# Patient Record
Sex: Male | Born: 1940 | Race: White | Hispanic: No | Smoking: Former smoker
Health system: Southern US, Community
[De-identification: ages and names within clinical notes are randomized; demographics above are authoritative.]

## PROBLEM LIST (undated history)

## (undated) DIAGNOSIS — C61 Malignant neoplasm of prostate: Secondary | ICD-10-CM

## (undated) DIAGNOSIS — K219 Gastro-esophageal reflux disease without esophagitis: Secondary | ICD-10-CM

## (undated) DIAGNOSIS — I10 Essential (primary) hypertension: Secondary | ICD-10-CM

## (undated) DIAGNOSIS — E7800 Pure hypercholesterolemia, unspecified: Secondary | ICD-10-CM

## (undated) HISTORY — DX: Malignant neoplasm of prostate: C61

## (undated) HISTORY — DX: Gastro-esophageal reflux disease without esophagitis: K21.9

## (undated) HISTORY — DX: Pure hypercholesterolemia, unspecified: E78.00

## (undated) HISTORY — PX: APPENDECTOMY: SHX54

## (undated) HISTORY — DX: Essential (primary) hypertension: I10

---

## 2011-02-25 ENCOUNTER — Other Ambulatory Visit: Payer: Self-pay | Admitting: Urology

## 2011-02-25 ENCOUNTER — Encounter (HOSPITAL_COMMUNITY): Payer: Medicare PPO

## 2011-02-25 DIAGNOSIS — Z9089 Acquired absence of other organs: Secondary | ICD-10-CM | POA: Insufficient documentation

## 2011-02-25 DIAGNOSIS — I10 Essential (primary) hypertension: Secondary | ICD-10-CM | POA: Insufficient documentation

## 2011-02-25 DIAGNOSIS — K219 Gastro-esophageal reflux disease without esophagitis: Secondary | ICD-10-CM | POA: Insufficient documentation

## 2011-02-25 DIAGNOSIS — Z79899 Other long term (current) drug therapy: Secondary | ICD-10-CM | POA: Insufficient documentation

## 2011-02-25 DIAGNOSIS — E7800 Pure hypercholesterolemia, unspecified: Secondary | ICD-10-CM | POA: Insufficient documentation

## 2011-02-25 DIAGNOSIS — C61 Malignant neoplasm of prostate: Secondary | ICD-10-CM | POA: Insufficient documentation

## 2011-02-25 DIAGNOSIS — Z01812 Encounter for preprocedural laboratory examination: Secondary | ICD-10-CM | POA: Insufficient documentation

## 2011-02-25 LAB — BASIC METABOLIC PANEL
BUN: 19 mg/dL (ref 6–23)
Chloride: 102 mEq/L (ref 96–112)
Creatinine, Ser: 1.04 mg/dL (ref 0.4–1.5)
GFR calc non Af Amer: >60 mL/min (ref >60)
Glucose, Bld: 101 mg/dL — ABNORMAL HIGH (ref 70–99)
Potassium: 4.2 mEq/L (ref 3.5–5.1)

## 2011-02-25 LAB — CBC
HCT: 39.4 % (ref 39.0–52.0)
MCHC: 34.3 g/dL (ref 30.0–36.0)
MCV: 85.5 fL (ref 78.0–100.0)
Platelets: 200 10*3/uL (ref 150–400)
RDW: 12.7 % (ref 11.5–15.5)

## 2011-03-04 ENCOUNTER — Inpatient Hospital Stay (HOSPITAL_COMMUNITY)
Admission: RE | Admit: 2011-03-04 | Discharge: 2011-03-05 | DRG: 708 | Disposition: A | Discharge status: Home / Self Care | Payer: Medicare PPO | Source: Ambulatory Visit | Attending: Urology | Admitting: Urology

## 2011-03-04 ENCOUNTER — Other Ambulatory Visit: Payer: Self-pay | Admitting: Urology

## 2011-03-04 DIAGNOSIS — I10 Essential (primary) hypertension: Secondary | ICD-10-CM | POA: Diagnosis present

## 2011-03-04 DIAGNOSIS — K219 Gastro-esophageal reflux disease without esophagitis: Secondary | ICD-10-CM | POA: Diagnosis present

## 2011-03-04 DIAGNOSIS — E7800 Pure hypercholesterolemia, unspecified: Secondary | ICD-10-CM | POA: Diagnosis present

## 2011-03-04 DIAGNOSIS — Z79899 Other long term (current) drug therapy: Secondary | ICD-10-CM

## 2011-03-04 DIAGNOSIS — C61 Malignant neoplasm of prostate: Principal | ICD-10-CM | POA: Diagnosis present

## 2011-03-04 DIAGNOSIS — Z01812 Encounter for preprocedural laboratory examination: Secondary | ICD-10-CM

## 2011-03-04 HISTORY — PX: OTHER SURGICAL HISTORY: SHX169

## 2011-03-04 LAB — ABO/RH: ABO/RH(D): O POS

## 2011-03-04 LAB — HEMOGLOBIN AND HEMATOCRIT, BLOOD: Hemoglobin: 13 g/dL (ref 13.0–17.0)

## 2011-03-10 NOTE — Op Note (Addendum)
Carl Moran, Carl Moran              ACCOUNT NO.:  [id number]  MEDICAL RECORD NO.:  [id number]  LOCATION:  PADM                         FACILITY:  [facility]  PHYSICIAN:  Heloise Purpura, MD      DATE OF BIRTH:  02-11-1941  DATE OF PROCEDURE:  03/04/2011 DATE OF DISCHARGE:  02/25/2011                              OPERATIVE REPORT   PREOPERATIVE DIAGNOSIS:  Clinically localized adenocarcinoma of the prostate (clinical stage T1C, N0, M0).  POSTOPERATIVE DIAGNOSIS:  Clinically localized adenocarcinoma of the prostate (clinical stage T1C, N0, M0).  PROCEDURE: 1. Robotic assisted laparoscopic radical prostatectomy (bilateral     nerve sparing). 2. Bilateral robotic assisted laparoscopic pelvic lymphadenectomy.  SURGEON:  Heloise Purpura, M.D.  ASSISTANT:  Delia Chimes, N.P.C.  ANESTHESIA:  General.  COMPLICATIONS:  None.  ESTIMATED BLOOD LOSS:  100 mL.  INTRAVENOUS FLUIDS:  1500 mL of lactated Ringer's.  SPECIMENS: 1. Prostate and seminal vesicles. 2. Bladder neck margin. 3. Right pelvic lymph nodes. 4. Left pelvic lymph nodes.  DISPOSITION:  Specimens to pathology.  DRAINS: 1. 20-French Quinton catheter. 2. #19 Blake pelvic drain.  INDICATION:  Mr. Zebrowski is a 70 year old gentleman, who was found to have an elevated PSA and underwent a prostate biopsy, which demonstrated a small volume Gleason 8 adenocarcinoma.  He underwent a metastatic evaluation, which was negative.  He initially had decided to proceed with a combination of androgen deprivation and external beam radiation therapy; however, he elected to reconsider his options especially considering his significantly enlarged prostate, which had measured 180 mL by ultrasound.  After further discussion, he elected to proceed with surgical treatment of his prostate cancer and a robotic prostatectomy. The potential risks, complications and alternative treatment options were discussed in detail and informed consent  obtained.  DESCRIPTION OF PROCEDURE:  The patient was taken to the operating room and a general anesthetic was administered.  He was given preoperative antibiotics, placed in the dorsal lithotomy position and prepped and draped in the usual sterile fashion.  Next, the preoperative time-out was performed.  A Foley catheter was then inserted into the bladder and the site was selected just superior to the umbilicus for placement of the camera port.  This was placed using a standard open Hassan technique which allowed entry into the peritoneal cavity under direct vision without difficulty.  A 12 mm port was then placed and a pneumoperitoneum established.  With the 0 degrees lens, the abdomen was inspected and there was no evidence of any intra-abdominal injuries or other abnormalities.  The remaining ports were then placed.  The 8 mm robotic ports were placed in the right lower quadrant, left lower quadrant and far left lower quadrant.  All ports were placed under direct vision without difficulty.  A 5 mm port was placed in the right upper quadrant and 12-mm port was placed in the far right lateral abdominal wall for laparoscopic assistance.  All ports were placed under direct vision and without difficulty.  The surgical cart was then docked.  There were noted to be some adhesions likely related to his prior appendectomy. These adhesions between the small intestine and the abdominal wall were carefully taken down with sharp scissor dissection.  The bladder was then reflected posteriorly allowing entry into the space of Retzius and identification of the endopelvic fascia and prostate.  The prostate was noted to be significantly enlarged, although there did appear to be some room on either side of the prostate, which would allow for removal robotically.  The periprosthetic fat was removed and the endopelvic fascia was then incised from the apex back to the base of the prostate bilaterally and  the underlying levator muscle fibers were swept laterally off the prostate.  The dorsal vein was then stapled and divided with a 45 mm flex echelon stapler.  The bladder neck was identified with the help of Foley catheter manipulation and was divided anteriorly.  There was noted to be a small median lobe, but mostly lateral lobe hypertrophy was identified.  Indigo carmine had been administered and the ureteral orifices were identified.  Dissection then proceeded to the posterior bladder neck and between the bladder and prostate until the vasa deferentia and seminal vesicles were identified. During this dissection, it was noted that within the bladder there was multiple small stone fragments, which were removed.  This was likely result of his BPH.  In addition, there was an area of tissue toward the right side of the bladder neck that did look somewhat suspicious for prostate tissue.  This was therefore excised and sent as a bladder neck margin.  The vasa deferentia were then identified, isolated and lifted anteriorly.  The seminal vesicles were dissected down to their tips with care to control the seminal vesicle arterial blood supply.  These structures were then also lifted anteriorly and the space between the Denonvillier's fascia and the anterior rectum was bluntly developed. This helped to isolate the vascular pedicles of prostate.  The lateral prostatic fascia was then sharply incised bilaterally allowing neurovascular bundle preservation.  The vascular pedicles of the prostate were then ligated with Hem-o-lok clips above the level of the neurovascular bundles and divided with sharp cold scissor dissection. The urethra was then sharply transected allowing the specimen to be disarticulated.  The pelvis was copiously irrigated and hemostasis was ensured.  There was noted be some venous bleeding along the right neurovascular bundle, which was controlled with a figure-of-eight  Vicryl suture.  Attention then turned to the right pelvic sidewall.  The fibrofatty tissue between the external iliac vein, confluence of the iliac vessels, hypogastric artery and Cooper's ligament was dissected free from the pelvic sidewall with care to preserve the obturator nerve. Hem-o-lok clips were used for lymphostasis and hemostasis.  An identical procedure was performed on the contralateral side and both lymphatic packets were subsequently removed for permanent pathologic analysis. Attention then turned to the urethral anastomosis.  A 2-0 Vicryl slip- knot was placed between Denonvillier's fascia, the posterior bladder neck and the posterior urethra to reapproximate these structures.  A double-armed 3-0 Monocryl suture was then used to perform a 360 degrees running tension-free anastomosis between the bladder neck and urethra. A new 20-French Quinton catheter was inserted into the bladder and irrigated.  There were no blood clots within the bladder and the anastomosis appeared to be watertight.  A #19 Blake drain was then brought through the left robotic port and positioned appropriately within the pelvis.  It was secured to skin with a nylon suture.  The surgical cart was then undocked.  The right lateral 12-mm port site was closed with the 0 Vicryl suture and placed laparoscopically.  All remaining ports were removed under direct vision and the  prostate and lymphatic packets were removed intact within separate Endopouch retrieval bag via the periumbilical port site.  This fascial opening was then closed with two running 0 Vicryl sutures.  0.25% percent Marcaine was then injected into all port sites, which were reapproximated at the skin level with staples.  Sterile dressings were applied.  The patient appeared to tolerate the procedure well and without complications.  He was able to be extubated and transferred to the recovery unit in satisfactory condition.     Heloise Purpura, MD     LB/MEDQ  D:  03/04/2011  T:  03/04/2011  Job:  [id number]  Electronically Signed by Heloise Purpura MD on 03/24/2011 10:46:40 PM

## 2011-03-11 NOTE — Discharge Summary (Signed)
Carl Moran, Carl Moran              ACCOUNT NO.:  [id number]  MEDICAL RECORD NO.:  [id number]           PATIENT TYPE:  I  LOCATION:  1441                         FACILITY:  [facility]  PHYSICIAN:  Heloise Purpura, MD      DATE OF BIRTH:  [birth year]  DATE OF ADMISSION:  03/04/2011 DATE OF DISCHARGE:  03/05/2011                              DISCHARGE SUMMARY   ADMISSION DIAGNOSIS:  Clinically localized adenocarcinoma of the prostate (clinical stage T1c, N0, M0).  DISCHARGE DIAGNOSIS:  Clinically localized adenocarcinoma of the prostate (clinical stage T1c, N0, M0).  PROCEDURES: 1. Robotic-assisted laparoscopic radical prostatectomy (bilateral     nerve-sparing). 2. Bilateral robotic-assisted laparoscopic pelvic lymphadenectomy.  HISTORY AND PHYSICAL:  For full details, please see admission history and physical.  Briefly, Carl Moran is a 70 year old gentleman who was found to have clinically localized adenocarcinoma of the prostate. After careful consideration regarding management options for treatment, he elected to proceed with surgical therapy and a robotic-assisted laparoscopic radical prostatectomy.  HOSPITAL COURSE:  On Mar 04, 2011, he was taken to the operating room where he underwent the above-named procedures, which he tolerated well and without complications.  Postoperatively he was able to be transferred to a regular room following recovery from anesthesia.  He did have one episodes of emesis last p.m., but was found to have no further complaints by postoperative day #1.  Therefore, he was able to tolerate a clear liquid diet.  He was able to begin ambulation postoperatively.  Postoperatively he was found to have a low urine output.  Therefore, his Foley catheter was hand-irrigated.  It was irrigated well and had no clots.  Therefore, a fluid bolus was given. His urine output increased dramatically.  He was found to have excellent urine output overnight on postoperative day  #1 with minimal output from his pelvic drain.  Therefore, his pelvic drain was removed.  He was found to be hemodynamically stable postoperatively as noted by a hemoglobin of 13.0.  He was also found to be hemodynamically stable on postoperative day #1 as noted by a hemoglobin of 11.7.  On the afternoon of postoperative day #1, he was tolerating a clear liquid diet without further complaints of nausea and vomiting.  He was able to ambulate without difficulty and his pain was well-managed, although he did complain of chronic low back pain, which was similar to his baseline. He was felt, therefore, stable for discharge home as he had met all discharge criteria.  DISPOSITION:  Home.  DISCHARGE INSTRUCTIONS:  He was instructed to be ambulatory, but specifically told to refrain from any heavy lifting, strenuous activity or driving.  He was instructed on routine Foley catheter care and told to gradually advance his diet over the course of the next few days.  DISCHARGE MEDICATIONS:  He was instructed to resume all home medications consisting of vitamin D2, losartan, terazosin, carvedilol, simvastatin and omeprazole.  In addition, he was provided a prescription for Vicodin to take as needed for pain and told to use Colace as a stool softener. He was also given a prescription for Cipro to begin one day prior to  return visit for removal of Foley catheter.  FOLLOWUP:  He will follow up in 7 days for further postoperative evaluation consisting of skin staple removal and removal of Foley catheter.     Delia Chimes, NP   ______________________________ Heloise Purpura, MD    MA/MEDQ  D:  03/05/2011  T:  03/05/2011  Job:  [id number]  Electronically Signed by Delia Chimes NP on 03/11/2011 03:09:46 PM Electronically Signed by Heloise Purpura MD on 03/11/2011 11:32:26 PM

## 2014-02-01 ENCOUNTER — Encounter: Payer: Self-pay | Admitting: Radiation Oncology

## 2014-02-01 NOTE — Progress Notes (Signed)
GU Location of Tumor / Histology: prostatic adenocarcinoma  If Prostate Cancer, Gleason Score is (4 + 3) and PSA is (10.27)  July 2012 PSA 0.18 September 2011 PSA 0.21 March 2012 PSA 0.19 December 2012 PSA 0.02 April 2013 PSA 0.06 July 2013 PSA 0.05 January 2014 PSA 0.37  Biopsies of prostate (if applicable) revealed:     Past/Anticipated interventions by urology, if any: prostatectomy  Past/Anticipated interventions by medical oncology, if any: NONE  Weight changes, if any: None noted  Bowel/Bladder complaints, if any: reports excellent urinary control and improved ED   Nausea/Vomiting, if any: None noted  Pain issues, if any:  None noted  SAFETY ISSUES:  Prior radiation? NO  Pacemaker/ICD? NO  Possible current pregnancy? N/A  Is the patient on methotrexate? N/A  Current Complaints / other details:  73 year old male. With biochemical recurrence of prostate cancer. Here to discuss salvage radiation. Divorced.

## 2014-02-04 ENCOUNTER — Ambulatory Visit: Payer: Medicare PPO

## 2014-02-04 ENCOUNTER — Ambulatory Visit: Payer: Medicare PPO | Admitting: Radiation Oncology

## 2014-02-24 ENCOUNTER — Encounter: Payer: Self-pay | Admitting: Radiation Oncology

## 2014-02-24 DIAGNOSIS — C61 Malignant neoplasm of prostate: Secondary | ICD-10-CM | POA: Insufficient documentation

## 2014-02-24 NOTE — Progress Notes (Signed)
Radiation Oncology         [phone]) [phone] ________________________________  Initial outpatient Consultation  Name: Carl Moran MRN: [id number]  Date: 02/25/2014  DOB: 06-17-1941  KD:TOIZTI,WPY, MD  Dutch Gray, MD   REFERRING PHYSICIAN: Dutch Gray, MD  DIAGNOSIS: 73 y.o. gentleman with stage T2c adenocarcinoma of the prostate with a Gleason's score of 4+3 and a post prostatectomy rising PSA of 0.37  HISTORY OF PRESENT ILLNESS::Carl Moran is a 72 y.o. gentleman found to have an elevated PSA of 10.27 prompting the diagnosis of high-grade localized prostate cancer on 02/22/2014 from transrectal ultrasound with 12 biopsies. These showed adenocarcinoma with Gleason score of 4+4 as displayed below:  He elected to undergo robotic-assisted laparoscopic radical prostatectomy with Dr. Alinda Money on 03/04/2011. Pathology from that procedure shown below demonstrated Gleason's 4+3 disease involving the bilateral gland including the apices. The surgical margins were negative, no extracapsular extension was noted, and no seminal vesicle involvement was noted. However, there is a comment that tumor approached to within 1 mm of one margin.   Postoperatively, the patient recovered his urinary continence in erectile function very well. His PSA became detectable and has steadily risen in followup to a most recent value of 0.37 on 01/08/2014      The patient reviewed the PSA results with his urologist and he has kindly been referred today for discussion of potential radiation treatment options.  PREVIOUS RADIATION THERAPY: No  PAST MEDICAL HISTORY:  has a past medical history of Prostate cancer; GERD (gastroesophageal reflux disease); Hypercholesterolemia; and Hypertension.    PAST SURGICAL HISTORY: Past Surgical History  Procedure Laterality Date  . Appendectomy    . History of prostatect retropubic radical w/nerve sparing laparoscopic  03/04/2011    FAMILY HISTORY: family history includes  Cancer in his brother, sister, and sister.  SOCIAL HISTORY:  reports that he quit smoking about 46 years ago. His smoking use included Cigarettes. He has a 4 pack-year smoking history. He has never used smokeless tobacco. He reports that he does not drink alcohol or use illicit drugs.  ALLERGIES: Review of patient's allergies indicates no known allergies.  MEDICATIONS:  Current Outpatient Prescriptions  Medication Sig Dispense Refill  . esomeprazole (NEXIUM) 40 MG capsule Take 40 mg by mouth daily at 12 noon.       No current facility-administered medications for this encounter.    REVIEW OF SYSTEMS:  A 15 point review of systems is documented in the electronic medical record. This was obtained by the nursing staff. However, I reviewed this with the patient to discuss relevant findings and make appropriate changes.  A comprehensive review of systems was negative..  The patient completed an IPSS and IIEF questionnaire.  His IPSS score was 6 indicating mild urinary outflow obstructive symptoms. He denies incontinence. He indicated that his erectile function is able to complete sexual activity on most attempts, especially with Viagra.   PHYSICAL EXAM: This patient is in no acute distress.  He is alert and oriented.   height is 6' (1.829 m) and weight is 207 lb 9.6 oz (94.167 kg). His oral temperature is 98 F (36.7 C). His blood pressure is 181/86 and his pulse is 57. His respiration is 16 and oxygen saturation is 100%.  He exhibits no respiratory distress or labored breathing.  He appears neurologically intact.  His mood is pleasant.  His affect is appropriate.  Please note the digital rectal exam findings described above.  KPS = 100  100 - Normal; no complaints; no  evidence of disease. 90   - Able to carry on normal activity; minor signs or symptoms of disease. 80   - Normal activity with effort; some signs or symptoms of disease. 61   - Cares for self; unable to carry on normal activity or to  do active work. 60   - Requires occasional assistance, but is able to care for most of his personal needs. 50   - Requires considerable assistance and frequent medical care. 48   - Disabled; requires special care and assistance. 79   - Severely disabled; hospital admission is indicated although death not imminent. 36   - Very sick; hospital admission necessary; active supportive treatment necessary. 10   - Moribund; fatal processes progressing rapidly. 0     - Dead  Karnofsky DA, Abelmann Cross, Craver LS and Burchenal [facility] [phone]) The use of the nitrogen mustards in the palliative treatment of carcinoma: with particular reference to bronchogenic carcinoma Cancer 1 634-56   LABORATORY DATA:  Lab Results  Component Value Date   WBC 7.1 02/25/2011   HGB 11.7* 03/05/2011   HCT 33.7* 03/05/2011   MCV 85.5 02/25/2011   PLT 200 02/25/2011   Lab Results  Component Value Date   NA 136 02/25/2011   K 4.2 02/25/2011   CL 102 02/25/2011   CO2 25 02/25/2011   No results found for this basename: ALT,  AST,  GGT,  ALKPHOS,  BILITOT     RADIOGRAPHY: No results found.    IMPRESSION: This gentleman is a 73 y.o. gentleman with stage T2c adenocarcinoma of the prostate with a Gleason's score of 4+3 and a post prostatectomy rising PSA of 0.37.  He has experienced biochemical recurrence. He did not have any typical adverse pathologic features to predict for local recurrence, other than one mention of a very close surgical margin. At this point, the patient may potentially benefit from radiotherapy to the prostatic fossa for salvage.  PLAN:  Today, I talked to the patient and family about the findings and work-up thus far.  We discussed the natural history of biochemically recurrent prostate cancer and general treatment, highlighting the role or radiotherapy in the management.  We discussed the available radiation techniques, and focused on the details of logistics and delivery.  We reviewed the anticipated acute and  late sequelae associated with radiation in this setting.  The patient was encouraged to ask questions that I answered to the best of my ability.  I filled out a patient counseling form during our discussion including treatment diagrams.  We retained a copy for our records.  The patient would like to consider proceeding with radiation and will be scheduled for CT simulation, but, has a trip to [facility] and will call when he gets back to set up simulation.  I spent 60 minutes minutes face to face with the patient and more than 50% of that time was spent in counseling and/or coordination of care.   ------------------------------------------------  Sheral Apley. Tammi Klippel, M.D.

## 2014-02-25 ENCOUNTER — Ambulatory Visit
Admission: RE | Admit: 2014-02-25 | Discharge: 2014-02-25 | Disposition: A | Discharge status: Home / Self Care | Payer: Medicare Other | Source: Ambulatory Visit | Attending: Radiation Oncology | Admitting: Radiation Oncology

## 2014-02-25 ENCOUNTER — Encounter: Payer: Self-pay | Admitting: Radiation Oncology

## 2014-02-25 VITALS — BP 181/86 | HR 57 | Temp 98.0°F | Resp 16 | Ht 72.0 in | Wt 207.6 lb

## 2014-02-25 DIAGNOSIS — C61 Malignant neoplasm of prostate: Secondary | ICD-10-CM | POA: Diagnosis present

## 2014-02-25 NOTE — Progress Notes (Signed)
See progress note under physician encounter. 

## 2014-02-25 NOTE — Progress Notes (Signed)
Reports occasional nocturia x 1 dependent on how much fluid he drinks before bed. Denies frequency. Denies hematuria or dysuria. Denies blood in stool, pain associated without bowel movements or diarrhea. Reports that he remains active playing music and exercising. Reports about a week ago he experience right hip pain while riding home from a show. Denies incontinence. Reports his urine stream is strong and steady. Reports improved erectile function. Most concerned about how radiation will affect his sex life. Denies hot flashes. Denies weight loss.

## 2014-02-27 NOTE — Addendum Note (Signed)
Encounter addended by: Cote Mayabb Mintz Teran Daughenbaugh, RN on: 02/27/2014  6:58 PM<BR>     Documentation filed: Charges VN

## 2014-03-01 ENCOUNTER — Encounter: Payer: Self-pay | Admitting: *Deleted

## 2014-03-01 NOTE — Progress Notes (Signed)
[facility] Psychosocial Distress Screening Clinical Social Work  Clinical Social Work was referred by distress screening protocol.  The patient scored a 5 on the Psychosocial Distress Thermometer which indicates moderate distress. Clinical Social Worker phoned pt to assess for distress and other psychosocial needs. CSW left message for pt to follow up with CSW as needed.  ONCBCN DISTRESS SCREENING 02/25/2014  Screening Type Initial Screening  Elta [place] the number that describes how much distress you have been experiencing in the past week 5  Family Problem type Partner  Emotional problem type Nervousness/Anxiety;Adjusting to illness  Information Concerns Type Lack of info about diagnosis;Lack of info about maintaining fitness  Physician notified of physical symptoms Yes  Referral to clinical psychology No  Referral to clinical social work No  Referral to dietition No  Referral to financial advocate No  Referral to support programs No  Referral to palliative care No    Clinical Social Worker follow up needed: no  If yes, follow up plan:   Loren Racer, [place]. [place] for [place] Wednesday, Thursday and Friday Phone: [phone] Fax: [phone]

## 2016-06-08 ENCOUNTER — Emergency Department (HOSPITAL_COMMUNITY): Payer: Medicare HMO

## 2016-06-08 ENCOUNTER — Emergency Department (HOSPITAL_COMMUNITY)
Admission: EM | Admit: 2016-06-08 | Discharge: 2016-06-08 | Disposition: A | Discharge status: Home / Self Care | Payer: Medicare HMO | Attending: Emergency Medicine | Admitting: Emergency Medicine

## 2016-06-08 ENCOUNTER — Encounter (HOSPITAL_COMMUNITY): Payer: Self-pay | Admitting: Emergency Medicine

## 2016-06-08 DIAGNOSIS — C419 Malignant neoplasm of bone and articular cartilage, unspecified: Secondary | ICD-10-CM | POA: Diagnosis not present

## 2016-06-08 DIAGNOSIS — R0789 Other chest pain: Secondary | ICD-10-CM | POA: Diagnosis not present

## 2016-06-08 DIAGNOSIS — I10 Essential (primary) hypertension: Secondary | ICD-10-CM | POA: Insufficient documentation

## 2016-06-08 DIAGNOSIS — C7951 Secondary malignant neoplasm of bone: Secondary | ICD-10-CM

## 2016-06-08 DIAGNOSIS — Z87891 Personal history of nicotine dependence: Secondary | ICD-10-CM | POA: Insufficient documentation

## 2016-06-08 DIAGNOSIS — Z8546 Personal history of malignant neoplasm of prostate: Secondary | ICD-10-CM | POA: Insufficient documentation

## 2016-06-08 DIAGNOSIS — M546 Pain in thoracic spine: Secondary | ICD-10-CM

## 2016-06-08 DIAGNOSIS — R079 Chest pain, unspecified: Secondary | ICD-10-CM | POA: Diagnosis present

## 2016-06-08 LAB — BASIC METABOLIC PANEL
ANION GAP: 5 (ref 5–15)
BUN: 12 mg/dL (ref 6–20)
CALCIUM: 9.2 mg/dL (ref 8.9–10.3)
CO2: 25 mmol/L (ref 22–32)
Chloride: 106 mmol/L (ref 101–111)
Creatinine, Ser: 1.23 mg/dL (ref 0.61–1.24)
GFR calc Af Amer: >60 mL/min (ref >60)
GFR, EST NON AFRICAN AMERICAN: 56 mL/min — AB (ref >60)
Glucose, Bld: 107 mg/dL — ABNORMAL HIGH (ref 65–99)
POTASSIUM: 4.6 mmol/L (ref 3.5–5.1)
SODIUM: 136 mmol/L (ref 135–145)

## 2016-06-08 LAB — CBC
HEMATOCRIT: 43 % (ref 39.0–52.0)
HEMOGLOBIN: 14 g/dL (ref 13.0–17.0)
MCH: 29.2 pg (ref 26.0–34.0)
MCHC: 32.6 g/dL (ref 30.0–36.0)
MCV: 89.8 fL (ref 78.0–100.0)
Platelets: 213 10*3/uL (ref 150–400)
RBC: 4.79 MIL/uL (ref 4.22–5.81)
RDW: 13.5 % (ref 11.5–15.5)
WBC: 10 10*3/uL (ref 4.0–10.5)

## 2016-06-08 LAB — I-STAT TROPONIN, ED: TROPONIN I, POC: 0 ng/mL (ref 0.00–0.08)

## 2016-06-08 MED ORDER — OXYCODONE-ACETAMINOPHEN 5-325 MG PO TABS: 1.0000 | ORAL_TABLET | ORAL | 0 refills | Status: DC | PRN

## 2016-06-08 MED ORDER — OXYCODONE-ACETAMINOPHEN 5-325 MG PO TABS
2.0000 | ORAL_TABLET | Freq: Once | ORAL | Status: AC
  Administered 2016-06-08: 2 via ORAL
  Filled 2016-06-08: qty 2

## 2016-06-08 NOTE — ED Notes (Signed)
MD at bedside. 

## 2016-06-08 NOTE — ED Triage Notes (Signed)
Pt sts CP x 6 months and sts back pain; pt sts hx of cancer on rib

## 2016-06-08 NOTE — ED Provider Notes (Signed)
[facility] DEPT Provider Note   CSN: [id number] Arrival date & time: 06/08/16  1002     History   Chief Complaint Chief Complaint  Patient presents with  . Chest Pain  . Back Pain    HPI Carl Moran is a 75 y.o. male.  75 year old male with past mental history including prostate cancer, GERD, hypertension, hyperlipidemia who presents with chest pain and back pain. The patient has a 6 month history of left lateral chest pain and upper back pain between his shoulder blades. The pain is sharp, intermittent, worse with certain movements, and worse with laying on these areas. Recently he was noted to have a lesion on his third left rib and had a biopsy and bone scan but has not received results from a biopsy yet. He did some lifting over the weekend which exacerbated the pain and he presented to an outside hospital ER 1 day ago. He was discharged home with pain medications which he has been taking but he reports persistent, severe pain. Last dose of Percocet was 1 tablet early this morning. He denies any associated shortness of breath or fevers. He does endorse decreased appetite. He is waiting to hear back from his PCP regarding the cancer workup.   The history is provided by the patient.  Chest Pain   Associated symptoms include back pain.  Back Pain   Associated symptoms include chest pain.    Past Medical History:  Diagnosis Date  . GERD (gastroesophageal reflux disease)   . Hypercholesterolemia   . Hypertension   . Prostate cancer [facility])     Patient Active Problem List   Diagnosis Date Noted  . Malignant neoplasm of prostate ([place]) 02/24/2014    Past Surgical History:  Procedure Laterality Date  . APPENDECTOMY    . history of prostatect retropubic radical w/nerve sparing laparoscopic  03/04/2011       Home Medications    Prior to Admission medications   Medication Sig Start Date End Date Taking? Authorizing Provider  esomeprazole (NEXIUM) 40 MG capsule Take  40 mg by mouth daily at 12 noon.    Historical Provider, MD    Family History Family History  Problem Relation Age of Onset  . Cancer Sister     kidney  . Cancer Brother     kidney  . Cancer Sister     lung    Social History Social History  Substance Use Topics  . Smoking status: Former Smoker    Packs/day: 0.50    Years: 8.00    Types: Cigarettes    Quit date: 10/19/1967  . Smokeless tobacco: Never Used  . Alcohol use No     Allergies   Review of patient's allergies indicates no known allergies.   Review of Systems Review of Systems  Cardiovascular: Positive for chest pain.  Musculoskeletal: Positive for back pain.   10 Systems reviewed and are negative for acute change except as noted in the HPI.   Physical Exam Updated Vital Signs BP 159/91 (BP Location: Right Arm)   Pulse 60   Temp 98.4 F (36.9 C) (Oral)   Resp 19   SpO2 97%   Physical Exam  Constitutional: He is oriented to person, place, and time. He appears well-developed and well-nourished. No distress.  Uncomfortable, standing up in room  HENT:  Head: Normocephalic and atraumatic.  Moist mucous membranes  Eyes: Conjunctivae are normal. Pupils are equal, round, and reactive to light.  Neck: Neck supple.  Cardiovascular: Normal rate, regular  rhythm and normal heart sounds.   No murmur heard. Pulmonary/Chest: Effort normal and breath sounds normal. He exhibits tenderness (L lateral upper ribs near axilla).  Abdominal: Soft. Bowel sounds are normal. He exhibits no distension. There is no tenderness.  Musculoskeletal: He exhibits no edema.  TTP midline thoracic spine at level of T3-T5 between shoulder blades, no stepoff  Neurological: He is alert and oriented to person, place, and time.  Fluent speech 5/5 strength and normal sensation x all 4 extremities  Skin: Skin is warm and dry.  Psychiatric: He has a normal mood and affect. Judgment normal.  Nursing note and vitals reviewed.    ED  Treatments / Results  Labs (all labs ordered are listed, but only abnormal results are displayed) Labs Reviewed  BASIC METABOLIC PANEL - Abnormal; Notable for the following:       Result Value   Glucose, Bld 107 (*)    GFR calc non Af Amer 56 (*)    All other components within normal limits  CBC  I-STAT TROPOININ, ED    EKG  EKG Interpretation  Date/Time:  Tuesday June 08 2016 10:05:46 EDT Ventricular Rate:  72 PR Interval:  194 QRS Duration: 90 QT Interval:  360 QTC Calculation: 394 R Axis:   75 Text Interpretation:  Normal sinus rhythm Normal ECG No previous ECGs available Confirmed by Tascha Casares MD, Jaydin Jalomo [phone]) on 06/08/2016 12:03:35 PM       Radiology Dg Chest 2 View  Result Date: 06/08/2016 CLINICAL DATA:  Left posterior chest pain since June of this year. EXAM: CHEST  2 VIEW COMPARISON:  06/07/2016.  05/12/2016. FINDINGS: Mild cardiomegaly. Aortic atherosclerosis. Mild volume loss in both lower lobes not seen previously. No evidence of consolidation, lobar collapse or effusion. Left posterior third rib lesion as seen previously, which was subsequently biopsied. No spinal fracture seen. IMPRESSION: Mild volume loss in the lower lobes not seen previously. Cardiomegaly and aortic atherosclerosis. Abnormal left posterior medial third rib as demonstrated on previous studies. Not apparently progressive. By history, this was biopsied. Electronically Signed   By: Nelson Chimes M.D.   On: 06/08/2016 10:41    Procedures Procedures (including critical care time)  Medications Ordered in ED Medications  oxyCODONE-acetaminophen (PERCOCET/ROXICET) 5-325 MG per tablet 2 tablet (not administered)     Initial Impression / Assessment and Plan / ED Course  I have reviewed the triage vital signs and the nursing notes.  Pertinent labs & imaging results that were available during my care of the patient were reviewed by me and considered in my medical decision making (see chart for  details).  Clinical Course   Patient with history of prostate cancer presents with 6 months of intermittent left lateral chest wall pain and pain between his shoulder blades that is worse with movement. He is awaiting biopsy results of a rib lesion. He was uncomfortable on exam but nontoxic, vital signs notable for mild hypertension. He had upper thoracic midline tenderness without step off and normal strength and sensation of extremities. EKG and labs including troponin are unremarkable.chest x-ray shows third rib lesion noted on previous studies. I reviewed the patient's workup from outside ER which included normal labs at that time. 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 Recent bone scan shows lesions at the third rib as well as T3 and T5 which are exactly the areas of his pain. Given this information and the chronicity of his pain, I  doubt ACS or PE. Provided the patient with pain medication and discussed the importance of taking on a schedule, starting medications to treat constipation, and contacting his internal medicine physician for referral to oncologist. I reviewed return precautions including hemoptysis, fever, shortness of breath, or any new symptoms. The patient his family voiced understanding and he was discharged in satisfactory condition.  Final Clinical Impressions(s) / ED Diagnoses   Final diagnoses:  None    New Prescriptions New Prescriptions   No medications on file     Sharlett Iles, MD 06/08/16 1253

## 2016-12-09 NOTE — Progress Notes (Signed)
" ° °  RADIATION ONCOLOGY FOLLOW UP  Patient Name: Carl Moran Encounter Date: 12/09/2016  Medical Record Number: [id number] Date of Birth: [birth year]  Referring Physician: Dr. 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 Prostate cancer metastatic to bone [facility])     PLAN:  Radiation Oncology Follow-up in 6 months or sooner should symptoms warrant. Patient will also be seen by Medical Oncology as scheduled.   As per his request he was given Nexium 20mg  to take daily.  He will discontinue Prilosec.  If his reflux continues to bother him he will see his PCP for this complaint.  RADIATION TREATMENT SUMMARY:  Radiation Oncology - Course: 1 Protocol:   Treatment Site Current Dose Modality From To Elapsed Days Fx.  1 T2-T8  2,000 cGy 15 MV  06/28/2016  07/02/2016   4  5           INTERVAL HISTORY:  He finished Taxotere 10/12/16 and had Firmagon in January.   No fever, chills or productive sputum.   His back and rib pain is essentially gone.  He notes occasional left sided rib pain. His primary complaint is GERD that is not helped by Prilosec.  He would like to try another prescription medication for GERD.  His PSA has decreased to 0.04 on 11/23/16.  CT on 10/14/16 showed: IMPRESSION: 1. Radiation changes involving the paramediastinal lungs bilaterally. 2. Radiation changes involving the left third rib, T3 vertebral body and T7 vertebral body with probable healing changes/sclerosis. 3. Left sixth sclerotic rib lesion appears stable. 4. 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 Recommend observation aligned future studies. 5. No abdominal/pelvic lymphadenopathy. 6. Status post prostatectomy.  Physical Exam: BP 122/77   Pulse 83   Temp 36.8 C (98.3 F) (Oral)   Resp 20   Wt 89.8 kg (198 lb)   SpO2 98%   BMI 26.85 kg/m  Karnofsky/Lansky Performance Status:  80 - active, but tired more quickly  (ECOG equivalent  1) CONSTITUTIONAL: Cognition is within normal limits. Speech is fluent and coherent. Memory appears intact for both recent and remote events. HEAD:  Normocephalic, atraumatic. EYES:  Sclerae anicteric. Pupils are equal, round. EOM are intact. ENMT:  Mucous membranes are moist. There is no palpable cervical, supraclavicular or infraclavicular lymphadenopathy.   CARDIOVASCULAR:  Heart is regular in rate and rhythm. LUNGS: CTA bilaterally. No crackles or wheezes.  ABDOMEN:  The abdomen is soft and non-tender to palpation. Bowel sounds are present. SKIN:  Exam is without rash, bruise or petechiae noted throughout. MUSCULOSKELETAL:  Strength is 5 of 5. EXTREMITIES: No cyanosis, clubbing or edema. NEUROLOGIC: Alert and oriented to person, place and time.  Gait is within normal limits.   Labs:  None  Electronically signed by: Powell Belling, MD Radiation Oncology Office: [phone]     "

## 2017-07-27 IMAGING — DX DG CHEST 2V
2 series · 2 of 2 positions shown · non-contrast
Comparison: 06/07/2016.  05/12/2016.

CLINICAL DATA: Left posterior chest pain since [REDACTED] of this year.

EXAM:
CHEST  2 VIEW

[w chest pa]
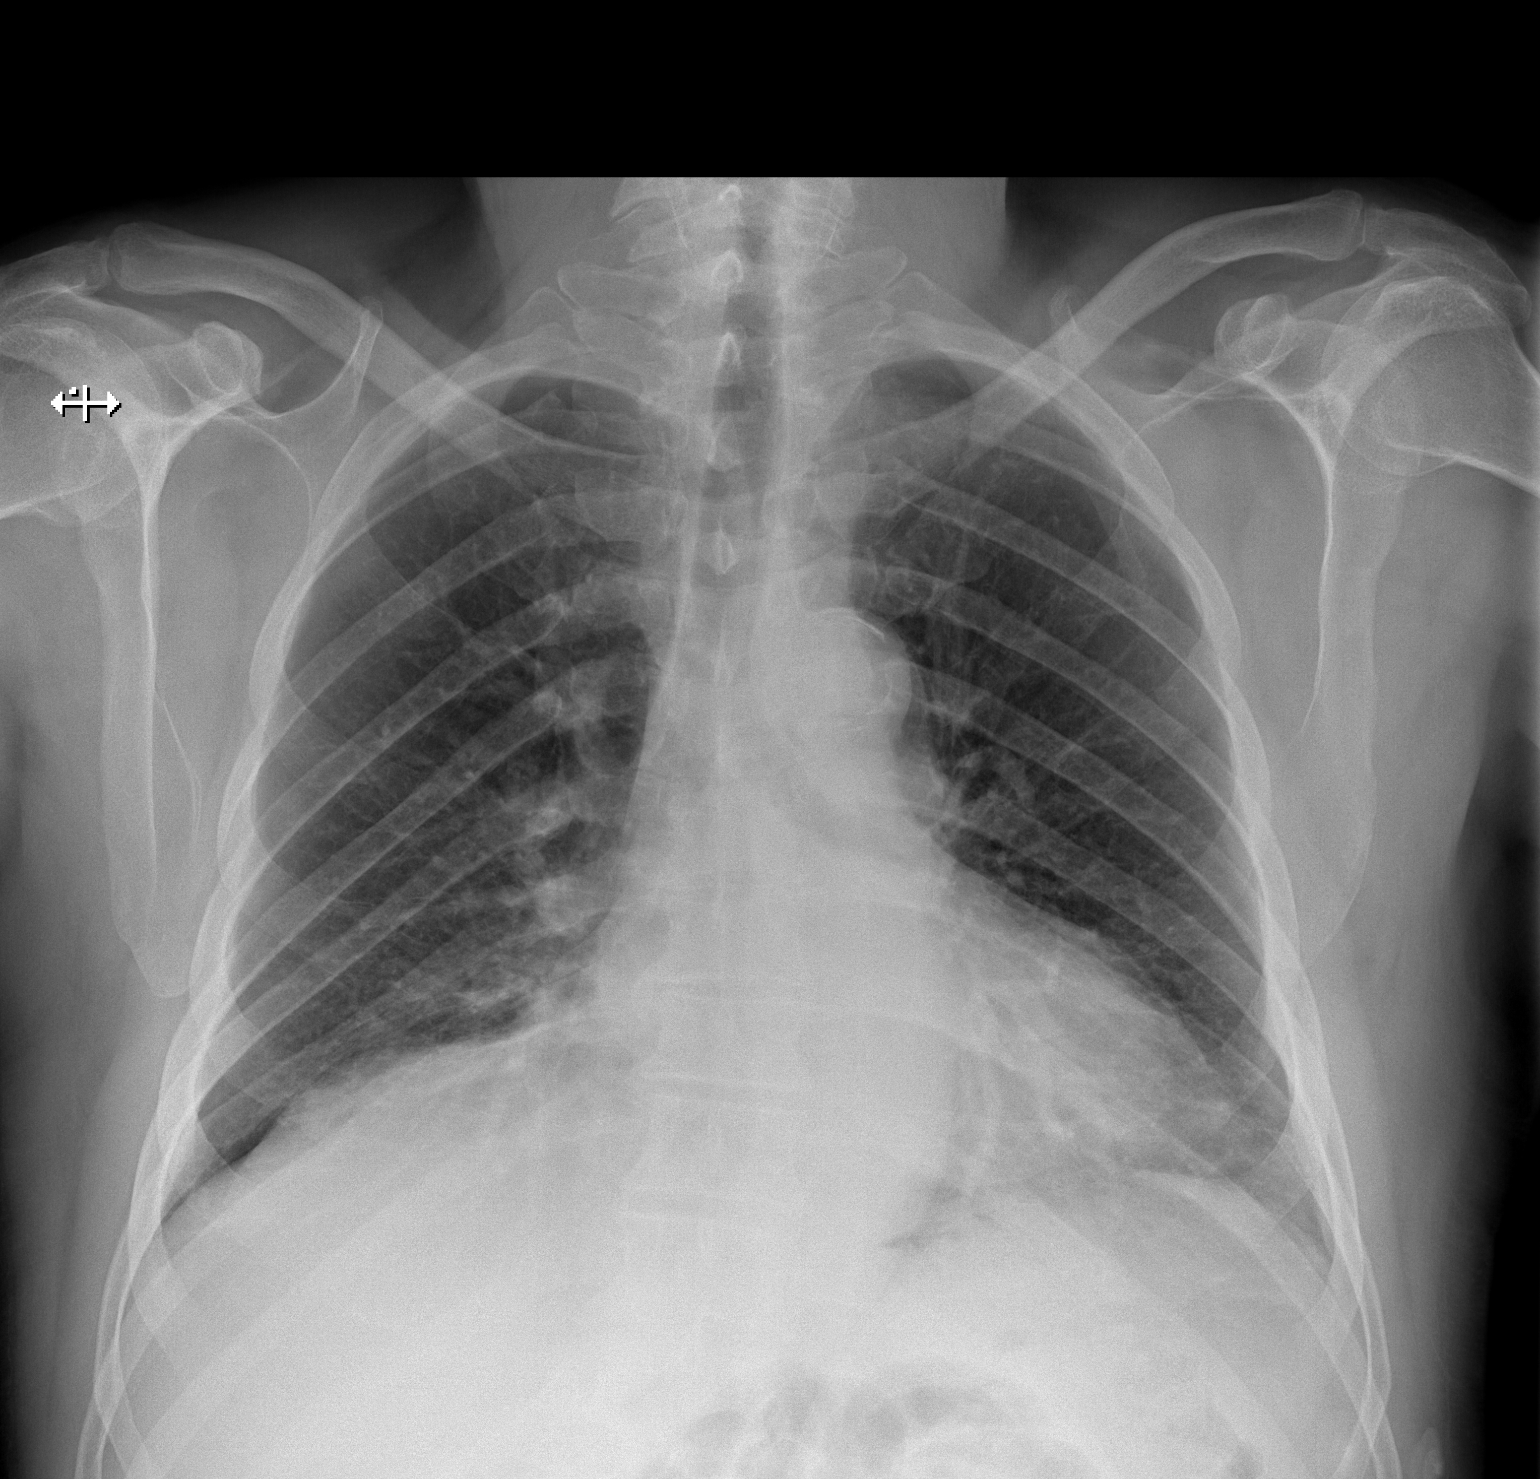

[w chest lat]
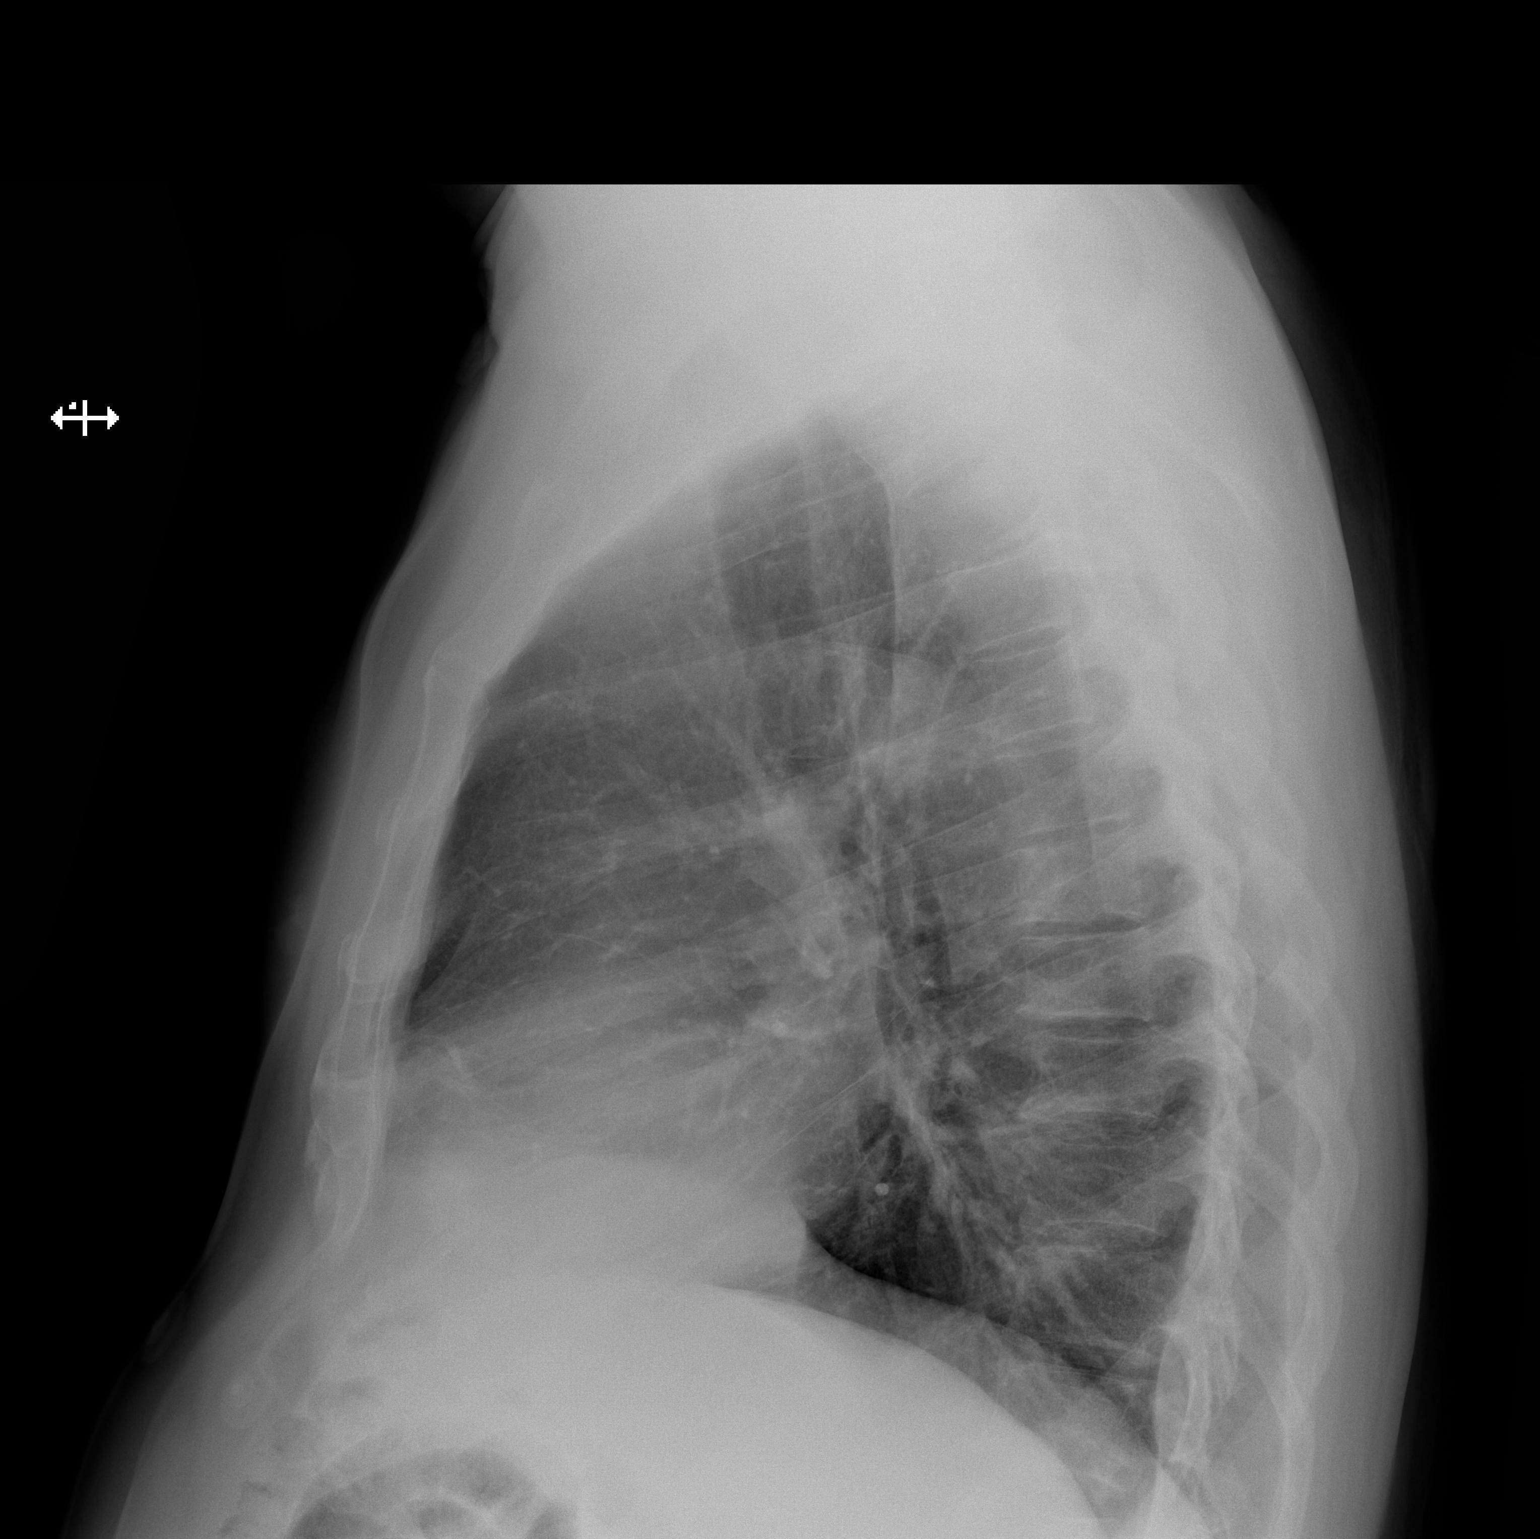

[2 of 2 positions shown; findings below may reference images not displayed]

FINDINGS: Mild cardiomegaly. Aortic atherosclerosis. Mild volume loss in both
lower lobes not seen previously. No evidence of consolidation, lobar
collapse or effusion. Left posterior third rib lesion as seen
previously, which was subsequently biopsied. No spinal fracture
seen.
IMPRESSION: Mild volume loss in the lower lobes not seen previously.

Cardiomegaly and aortic atherosclerosis.

Abnormal left posterior medial third rib as demonstrated on previous
studies. Not apparently progressive. By history, this was biopsied.

## 2023-10-19 DEATH — deceased
